# Patient Record
Sex: Female | Born: 1935 | Race: Black or African American | Hispanic: No
Health system: Southern US, Community
[De-identification: ages and names within clinical notes are randomized; demographics above are authoritative.]

---

## 2001-04-07 ENCOUNTER — Ambulatory Visit (HOSPITAL_COMMUNITY): Admission: RE | Admit: 2001-04-07 | Discharge: 2001-04-07 | Payer: Self-pay | Admitting: Ophthalmology

## 2003-05-29 ENCOUNTER — Emergency Department (HOSPITAL_COMMUNITY): Admission: EM | Admit: 2003-05-29 | Discharge: 2003-05-29 | Payer: Self-pay | Admitting: Internal Medicine

## 2003-05-29 ENCOUNTER — Encounter: Payer: Self-pay | Admitting: Internal Medicine

## 2004-03-01 ENCOUNTER — Emergency Department (HOSPITAL_COMMUNITY): Admission: EM | Admit: 2004-03-01 | Discharge: 2004-03-01 | Payer: Self-pay | Admitting: Emergency Medicine

## 2004-06-03 ENCOUNTER — Emergency Department (HOSPITAL_COMMUNITY): Admission: EM | Admit: 2004-06-03 | Discharge: 2004-06-03 | Payer: Self-pay | Admitting: Emergency Medicine

## 2004-11-01 ENCOUNTER — Emergency Department (HOSPITAL_COMMUNITY): Admission: EM | Admit: 2004-11-01 | Discharge: 2004-11-01 | Payer: Self-pay | Admitting: Emergency Medicine

## 2004-12-03 ENCOUNTER — Emergency Department (HOSPITAL_COMMUNITY): Admission: EM | Admit: 2004-12-03 | Discharge: 2004-12-03 | Payer: Self-pay | Admitting: Family Medicine

## 2009-03-15 ENCOUNTER — Ambulatory Visit (HOSPITAL_COMMUNITY): Admission: RE | Admit: 2009-03-15 | Discharge: 2009-03-15 | Payer: Self-pay | Admitting: Internal Medicine

## 2009-03-15 ENCOUNTER — Ambulatory Visit: Payer: Self-pay | Admitting: Cardiology

## 2009-03-15 ENCOUNTER — Encounter (INDEPENDENT_AMBULATORY_CARE_PROVIDER_SITE_OTHER): Payer: Self-pay | Admitting: Internal Medicine

## 2009-06-10 ENCOUNTER — Inpatient Hospital Stay (HOSPITAL_COMMUNITY): Admission: EM | Admit: 2009-06-10 | Discharge: 2009-06-14 | Payer: Self-pay | Admitting: Emergency Medicine

## 2010-09-02 IMAGING — CT CT CERVICAL SPINE W/O CM
3 of 5 series · 11 of 33 positions shown, 13 images · non-contrast
Comparison: None

CT HEAD

CLINICAL DATA: Fall, left sided headache and neck pain

CT HEAD WITHOUT CONTRAST
CT CERVICAL SPINE WITHOUT CONTRAST
TECHNIQUE: Multidetector CT imaging of the head and cervical spine
was performed following the standard protocol without intravenous
contrast.  Multiplanar CT image reconstructions of the cervical
spine were also generated.

[Series 4: cervical 2.0 b31s · axial · 0.35mm/px · z∈[+1155,+1276]mm · 3 of 134 slices shown, 4 images]
[im 27/134  soft-tissue]
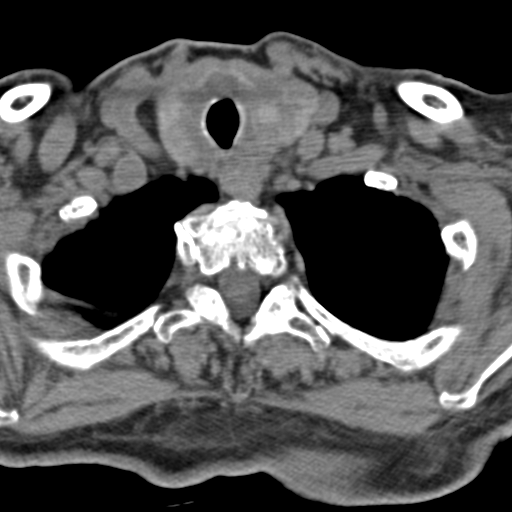
[im 27/134  bone]
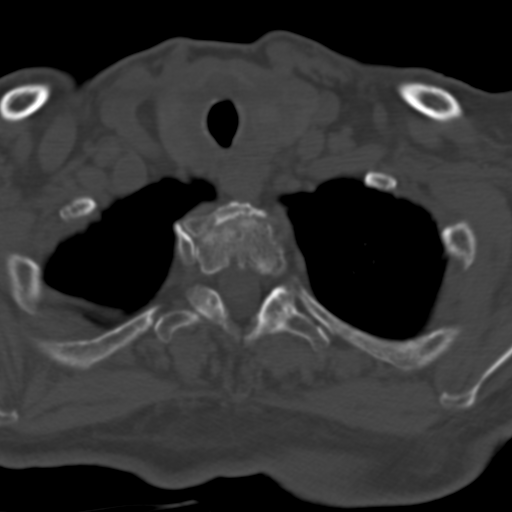
[im 80/134  bone]
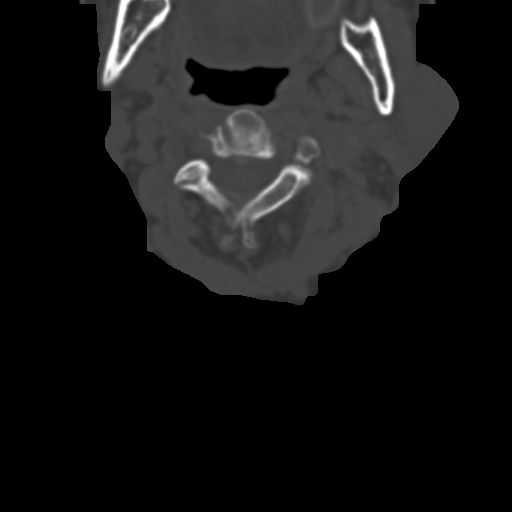
[im 107/134  bone]
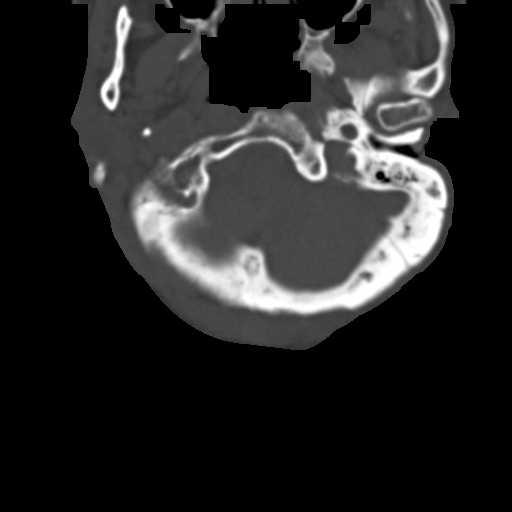

[Series 6: cervical 2.0 spo · coronal · 0.26mm/px · 3 of 84 slices shown (1 of 2)]
[im 17/84  bone]
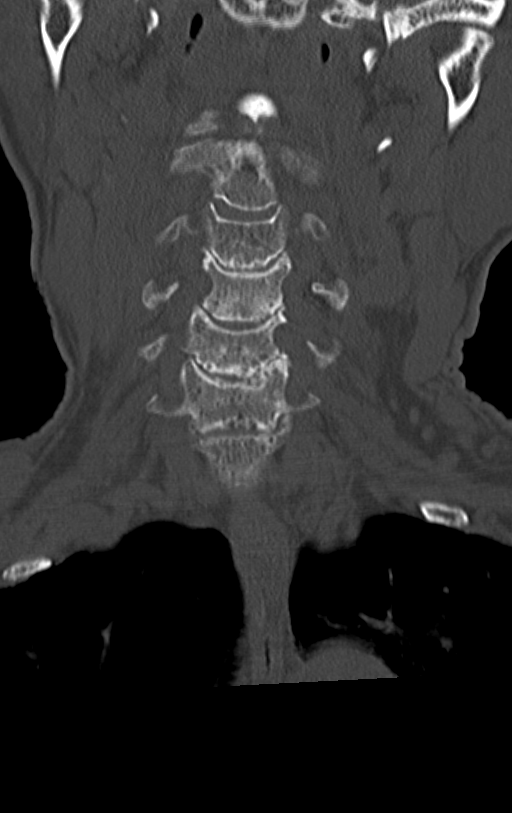
[im 34/84  bone]
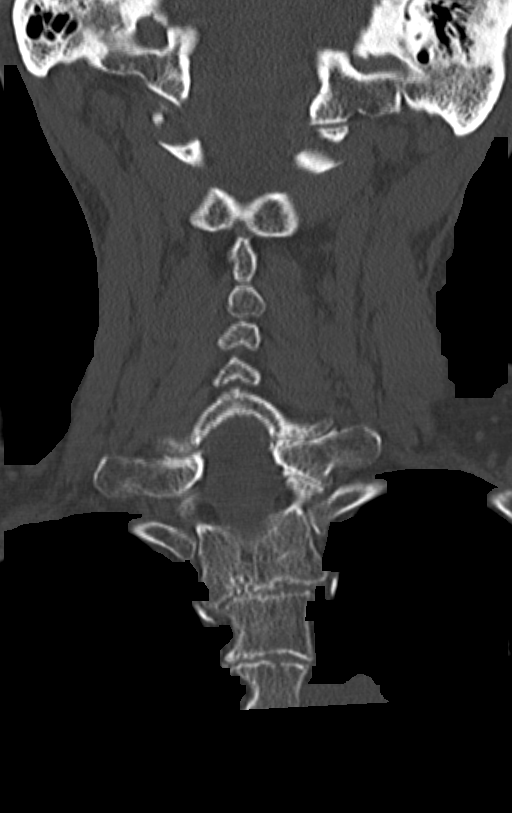
[im 50/84  bone]
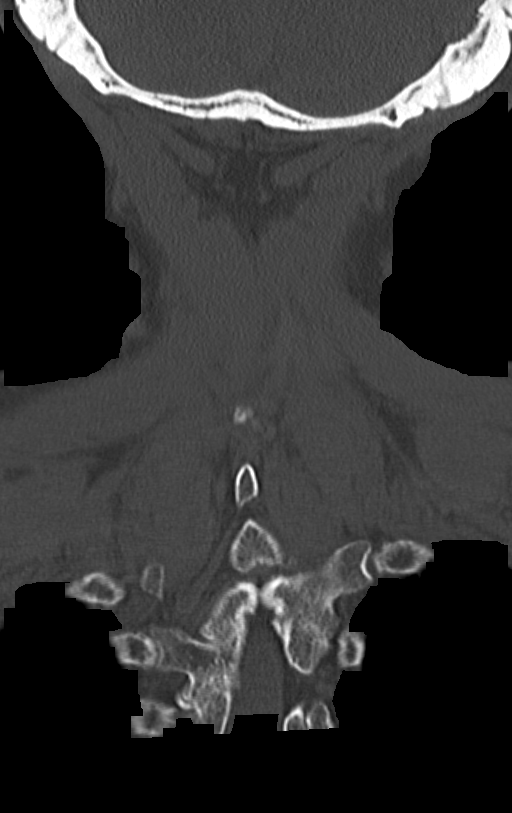

[Series 7: cervical 2.0 spo · sagittal · 0.25mm/px · 5 of 81 slices shown, 6 images (2 of 2)]
[im 27/81  bone]
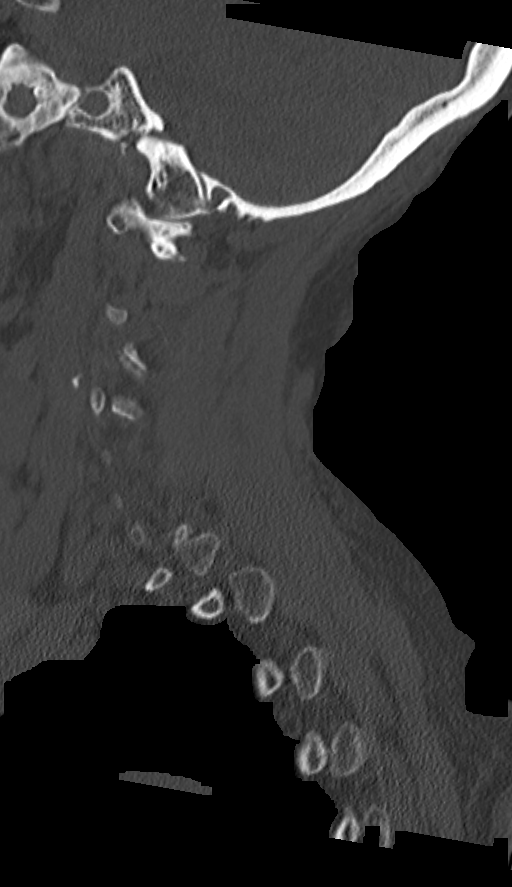
[im 34/81  bone]
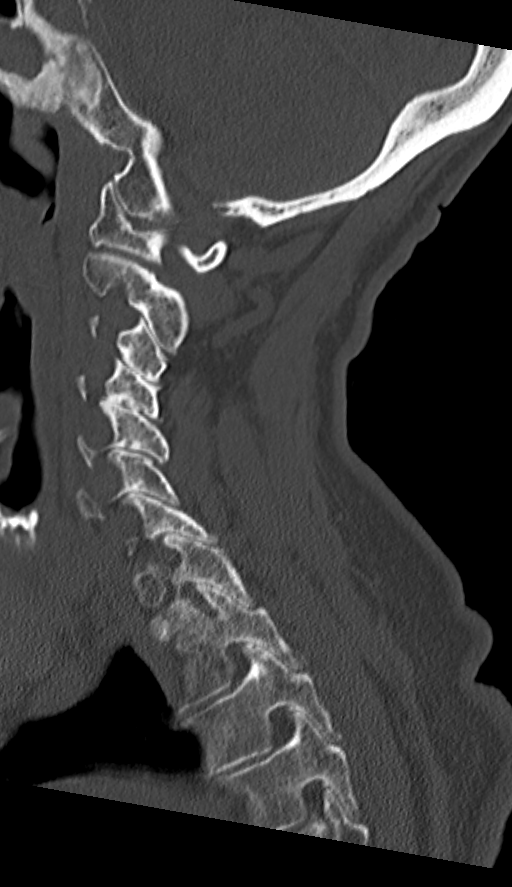
[im 41/81  soft-tissue]
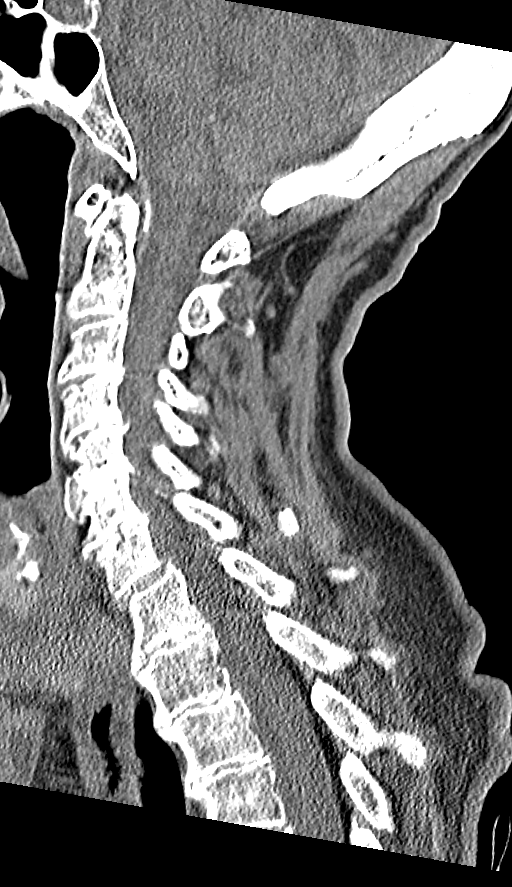
[im 41/81  bone]
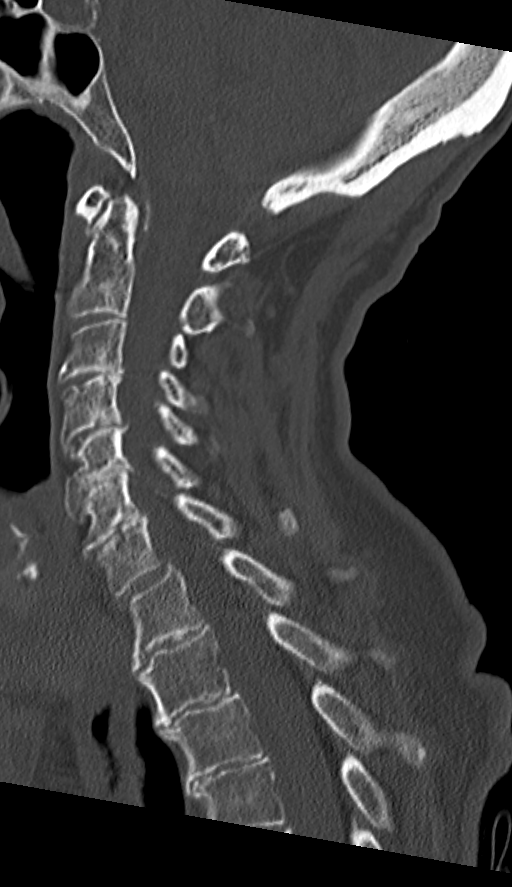
[im 47/81  bone]
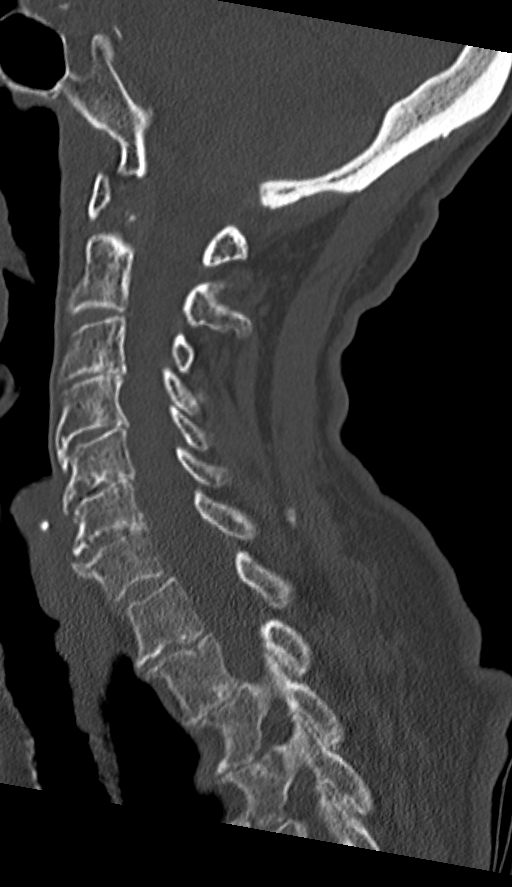
[im 54/81  bone]
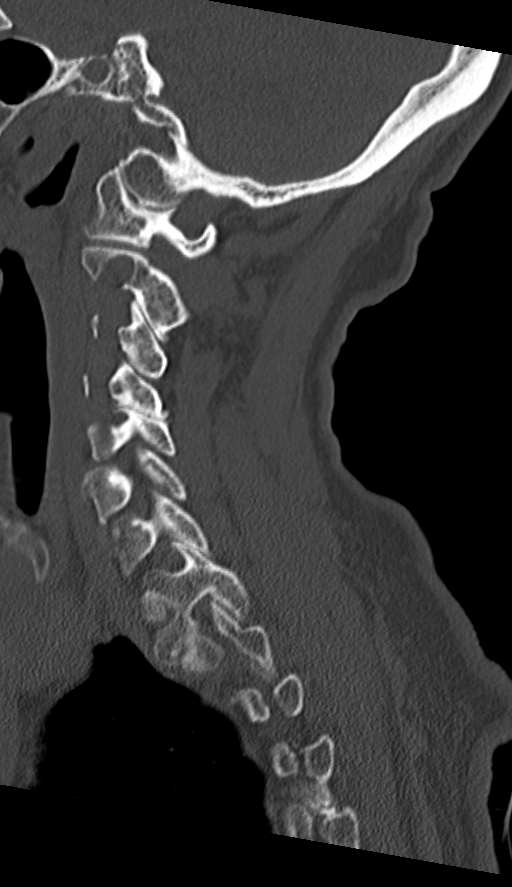

[11 of 33 positions shown; findings below may reference images not displayed]

FINDINGS: Partial opacification of the left sphenoid sinus is noted
with high density internal material (likely inspissated secretions)
and bony sclerosis.  No acute bony abnormality is seen.  Soft
tissue deformity over the right frontal scalp is noted without
underlying skull fracture.  No acute intracranial hemorrhage, acute
infarct, or mass lesion is seen.  Mild diffuse cortical volume loss
noted with proportional ventricular prominence.
IMPRESSION: No acute intracranial finding.  Mild diffuse cortical volume loss.

Right frontal scalp deformity, correlate for laceration in this
area.

Chronic left sphenoid sinusitis.

CT CERVICAL SPINE
FINDINGS: C1 through the cervical thoracic junction is visualized
in its entirety. Multilevel disc degenerative change is noted with
loss of intervertebral disc space and proliferative osteophyte
formation, most prominent at C3 - C4, C5 - C6, and C6 - C7.  No
compression deformity is seen.  There is 2 mm anterolisthesis of C7
on T1.  Multilevel facet osteoarthritic change is identified.
Bilateral multilevel associated neural foraminal narrowing is
present.
IMPRESSION: No acute osseous abnormality.  Multilevel disc degenerative change.

## 2011-03-03 LAB — CBC
HCT: 28.4 % — ABNORMAL LOW (ref 36.0–46.0)
HCT: 29.5 % — ABNORMAL LOW (ref 36.0–46.0)
HCT: 32.5 % — ABNORMAL LOW (ref 36.0–46.0)
Hemoglobin: 11.1 g/dL — ABNORMAL LOW (ref 12.0–15.0)
Hemoglobin: 9.7 g/dL — ABNORMAL LOW (ref 12.0–15.0)
Hemoglobin: 9.9 g/dL — ABNORMAL LOW (ref 12.0–15.0)
MCHC: 33.5 g/dL (ref 30.0–36.0)
MCHC: 33.9 g/dL (ref 30.0–36.0)
MCHC: 34.3 g/dL (ref 30.0–36.0)
MCHC: 34.3 g/dL (ref 30.0–36.0)
MCV: 88.5 fL (ref 78.0–100.0)
MCV: 89.2 fL (ref 78.0–100.0)
MCV: 89.2 fL (ref 78.0–100.0)
MCV: 89.6 fL (ref 78.0–100.0)
Platelets: 191 10*3/uL (ref 150–400)
Platelets: 202 10*3/uL (ref 150–400)
Platelets: 217 10*3/uL (ref 150–400)
RBC: 3.16 MIL/uL — ABNORMAL LOW (ref 3.87–5.11)
RBC: 3.31 MIL/uL — ABNORMAL LOW (ref 3.87–5.11)
RBC: 3.42 MIL/uL — ABNORMAL LOW (ref 3.87–5.11)
RBC: 3.64 MIL/uL — ABNORMAL LOW (ref 3.87–5.11)
RDW: 14.8 % (ref 11.5–15.5)
RDW: 15.2 % (ref 11.5–15.5)
WBC: 4.4 10*3/uL (ref 4.0–10.5)
WBC: 5.9 10*3/uL (ref 4.0–10.5)

## 2011-03-03 LAB — BASIC METABOLIC PANEL
BUN: 13 mg/dL (ref 6–23)
BUN: 17 mg/dL (ref 6–23)
BUN: 8 mg/dL (ref 6–23)
CO2: 26 mEq/L (ref 19–32)
CO2: 28 mEq/L (ref 19–32)
CO2: 29 mEq/L (ref 19–32)
CO2: 33 mEq/L — ABNORMAL HIGH (ref 19–32)
Calcium: 8.6 mg/dL (ref 8.4–10.5)
Calcium: 9.3 mg/dL (ref 8.4–10.5)
Chloride: 101 mEq/L (ref 96–112)
Chloride: 105 mEq/L (ref 96–112)
Chloride: 108 mEq/L (ref 96–112)
Chloride: 109 mEq/L (ref 96–112)
Creatinine, Ser: 0.64 mg/dL (ref 0.4–1.2)
Creatinine, Ser: 0.75 mg/dL (ref 0.4–1.2)
Creatinine, Ser: 0.8 mg/dL (ref 0.4–1.2)
Creatinine, Ser: 0.9 mg/dL (ref 0.4–1.2)
GFR calc Af Amer: >60 mL/min (ref >60)
GFR calc Af Amer: >60 mL/min (ref >60)
GFR calc Af Amer: >60 mL/min (ref >60)
GFR calc non Af Amer: 59 mL/min — ABNORMAL LOW (ref >60)
GFR calc non Af Amer: >60 mL/min (ref >60)
Glucose, Bld: 107 mg/dL — ABNORMAL HIGH (ref 70–99)
Glucose, Bld: 61 mg/dL — ABNORMAL LOW (ref 70–99)
Potassium: 3.5 mEq/L (ref 3.5–5.1)
Potassium: 3.6 mEq/L (ref 3.5–5.1)
Potassium: 3.8 mEq/L (ref 3.5–5.1)
Sodium: 138 mEq/L (ref 135–145)
Sodium: 142 mEq/L (ref 135–145)
Sodium: 143 mEq/L (ref 135–145)

## 2011-03-03 LAB — DIFFERENTIAL
Basophils Absolute: 0 10*3/uL (ref 0.0–0.1)
Basophils Absolute: 0 10*3/uL (ref 0.0–0.1)
Basophils Relative: 0 % (ref 0–1)
Basophils Relative: 0 % (ref 0–1)
Basophils Relative: 0 % (ref 0–1)
Basophils Relative: 0 % (ref 0–1)
Eosinophils Absolute: 0 10*3/uL (ref 0.0–0.7)
Eosinophils Absolute: 0.1 10*3/uL (ref 0.0–0.7)
Eosinophils Absolute: 0.1 10*3/uL (ref 0.0–0.7)
Eosinophils Absolute: 0.1 10*3/uL (ref 0.0–0.7)
Eosinophils Relative: 0 % (ref 0–5)
Eosinophils Relative: 3 % (ref 0–5)
Eosinophils Relative: 4 % (ref 0–5)
Lymphocytes Relative: 13 % (ref 12–46)
Lymphocytes Relative: 42 % (ref 12–46)
Lymphs Abs: 0.8 10*3/uL (ref 0.7–4.0)
Lymphs Abs: 1.2 10*3/uL (ref 0.7–4.0)
Lymphs Abs: 1.9 10*3/uL (ref 0.7–4.0)
Monocytes Absolute: 0.2 10*3/uL (ref 0.1–1.0)
Monocytes Absolute: 0.3 10*3/uL (ref 0.1–1.0)
Monocytes Absolute: 0.3 10*3/uL (ref 0.1–1.0)
Monocytes Relative: 4 % (ref 3–12)
Monocytes Relative: 7 % (ref 3–12)
Monocytes Relative: 7 % (ref 3–12)
Monocytes Relative: 9 % (ref 3–12)
Neutro Abs: 2.1 10*3/uL (ref 1.7–7.7)
Neutro Abs: 4.9 10*3/uL (ref 1.7–7.7)
Neutrophils Relative %: 48 % (ref 43–77)
Neutrophils Relative %: 56 % (ref 43–77)
Neutrophils Relative %: 83 % — ABNORMAL HIGH (ref 43–77)

## 2011-03-03 LAB — URINALYSIS, ROUTINE W REFLEX MICROSCOPIC
Bilirubin Urine: NEGATIVE
Glucose, UA: NEGATIVE mg/dL
Ketones, ur: 15 mg/dL — AB
Leukocytes, UA: NEGATIVE
Nitrite: NEGATIVE
Protein, ur: NEGATIVE mg/dL
Specific Gravity, Urine: 1.015 (ref 1.005–1.030)
Urobilinogen, UA: 0.2 mg/dL (ref 0.0–1.0)
pH: 5.5 (ref 5.0–8.0)

## 2011-03-03 LAB — GLUCOSE, CAPILLARY
Glucose-Capillary: 113 mg/dL — ABNORMAL HIGH (ref 70–99)
Glucose-Capillary: 116 mg/dL — ABNORMAL HIGH (ref 70–99)
Glucose-Capillary: 132 mg/dL — ABNORMAL HIGH (ref 70–99)
Glucose-Capillary: 165 mg/dL — ABNORMAL HIGH (ref 70–99)
Glucose-Capillary: 178 mg/dL — ABNORMAL HIGH (ref 70–99)
Glucose-Capillary: 187 mg/dL — ABNORMAL HIGH (ref 70–99)
Glucose-Capillary: 284 mg/dL — ABNORMAL HIGH (ref 70–99)
Glucose-Capillary: 73 mg/dL (ref 70–99)
Glucose-Capillary: 74 mg/dL (ref 70–99)

## 2011-03-03 LAB — URINE MICROSCOPIC-ADD ON

## 2011-03-03 LAB — URINE CULTURE
Colony Count: NO GROWTH
Culture: NO GROWTH

## 2011-03-03 LAB — POCT CARDIAC MARKERS
CKMB, poc: 1.5 ng/mL (ref 1.0–8.0)
Myoglobin, poc: 142 ng/mL (ref 12–200)
Troponin i, poc: <0.05 ng/mL (ref 0.00–0.09)

## 2011-04-09 NOTE — H&P (Signed)
NAME:  Kristen Fitzpatrick, CLINKSCALES NO.:  [id number]   MEDICAL RECORD NO.:  [id number]          PATIENT TYPE:  INP   LOCATION:  A338                          FACILITY:  [facility]   PHYSICIAN:  Tesfaye D. Felecia Shelling, MD   DATE OF BIRTH:  [birth year]   DATE OF ADMISSION:  06/09/2009  DATE OF DISCHARGE:  LH                              HISTORY & PHYSICAL   CHIEF COMPLAINT:  Accidental fall.   HISTORY OF PRESENT ILLNESS:  This is an 77 years old female patient with  history of diabetes mellitus and hypertension was brought to emergency  room after the patient fell at her home and was unable to get up.  The  patient lives alone by herself.  She was found soaked with urine and  feces.  The patient was hungry and she did not remember when she ate  last.  She was then evaluated in the emergency room with CT scan of the  head and several x-rays.  However, her CT scan and bone survey were  within the normal limits.  He had a chest x-ray showed sign of left  lower lobe opacity, which could be due to pneumonia.  The patient was  started on IV antibiotics and admitted for further treatment.   REVIEW OF SYSTEMS:  The patient claims she feels better.  No specific  complaints.  No fever, chills, chest pain, shortness of breath, nausea,  vomiting, abdominal pain, urgency or frequency of urination.   PAST MEDICAL HISTORY:  1. Diabetes mellitus.  2. Hypertension.   CURRENT MEDICATIONS:  1. Glyburide/metformin 1.25/250 one tablet b.i.d.  2. lisinopril and hydrochlorothiazide 20/25 one tablet daily.   SOCIAL HISTORY:  The patient lives alone.  She has no history of  alcohol, tobacco, or substance abuse.   PHYSICAL EXAMINATION:  GENERAL:  The patient is alert, awake, but  confused and disoriented.  VITAL SIGNS:  Blood pressure 110/67, pulse 73, respiratory rate 18,  temperature 97.8 degrees Fahrenheit.  HEENT:  Pupils are equal and reactive.  NECK:  Supple.  CHEST:  Decreased air entry, few  rhonchi.  CARDIOVASCULAR:  First and second heart sounds heard.  No murmur, no  gallop.  ABDOMEN:  Soft and lax.  Bowel sound is positive.  No mass or  organomegaly.  EXTREMITIES:  No leg edema.   LABORATORY DATA:  CBC, WBC 5.9, hemoglobin 11.1, hematocrit 32.5,  platelet 217.  BMP, sodium 138, potassium 3.5, chloride 101, carbon  dioxide 26, glucose 61, BUN 17, creatinine 0.7, and calcium 9.3.   ASSESSMENT:  1. Probably pneumonia.  2. Failure to thrive.  3. Accidental fall secondary to generalized deconditioning and      inability to perform her daily living activity.  4. History of diabetes mellitus.  5. History of hypertension.   PLAN:  We will continue patient on IV antibiotics.  Continue assisting  in her daily living activity.  We will do social work consultation for  possible placement and assistance, and continue supportive care.      Tesfaye D. Felecia Shelling, MD  Electronically Signed     TDF/MEDQ  D:  06/10/2009  T:  06/10/2009  Job:  [id number]

## 2011-04-12 NOTE — Discharge Summary (Signed)
NAME:  Kristen Fitzpatrick, SWISS NO.:  [id number]   MEDICAL RECORD NO.:  [id number]          PATIENT TYPE:  INP   LOCATION:  A338                          FACILITY:  [facility]   PHYSICIAN:  Tesfaye D. Felecia Shelling, MD   DATE OF BIRTH:  10-30-1919   DATE OF ADMISSION:  06/09/2009  DATE OF DISCHARGE:  07/21/2010LH                               DISCHARGE SUMMARY   DISCHARGE DIAGNOSES:  1. Left lower lobe pneumonia.  2. Diarrhea.  3. Failure to thrive.  4. Diabetes mellitus.  5. History of fall.   DISCHARGE MEDICATIONS:  1. Glyburide 2.5 mg b.i.d.  2. Lisinopril 20 mg daily.  3. Augmentin 500 mg b.i.d. for five more days.  4. Timolol eye drops b.i.d.  5. Alphagan eye drops b.i.d.   DISPOSITION:  The patient was discharged to home with her daughter.   HOSPITAL COURSE:  This is an 77 year old female patient with a history  of hypertension and diabetes mellitus was brought to emergency room  after she fell and was unable to get up.  The patient used to live alone  by herself.  She was evaluated in the emergency room and was found to  have left lower lobe pneumonia.  The patient had also symptoms of  failure to thrive.  She was admitted and started on IV antibiotics.  The  patient gradually improved.  She was discharged home with her daughter  to continue her regular treatment.      Tesfaye D. Felecia Shelling, MD  Electronically Signed     TDF/MEDQ  D:  06/30/2009  T:  06/30/2009  Job:  [id number]

## 2013-12-26 DEATH — deceased

## 2014-01-04 ENCOUNTER — Telehealth: Payer: Self-pay

## 2014-01-04 NOTE — Telephone Encounter (Signed)
Patient past away per [facility] [place] °
